# Patient Record
Sex: Female | Born: 1970 | Race: White | Hispanic: No | Marital: Single | Smoking: Former smoker
Health system: Southern US, Community
[De-identification: ages and names within clinical notes are randomized; demographics above are authoritative.]

## PROBLEM LIST (undated history)

## (undated) DIAGNOSIS — E039 Hypothyroidism, unspecified: Secondary | ICD-10-CM

## (undated) DIAGNOSIS — E785 Hyperlipidemia, unspecified: Secondary | ICD-10-CM

## (undated) DIAGNOSIS — F419 Anxiety disorder, unspecified: Secondary | ICD-10-CM

## (undated) HISTORY — PX: TUBAL LIGATION: SHX77

---

## 2006-05-13 ENCOUNTER — Other Ambulatory Visit: Admission: RE | Admit: 2006-05-13 | Discharge: 2006-05-13 | Payer: Self-pay | Admitting: Obstetrics and Gynecology

## 2006-07-01 ENCOUNTER — Emergency Department (HOSPITAL_COMMUNITY): Admission: EM | Admit: 2006-07-01 | Discharge: 2006-07-01 | Payer: Self-pay | Admitting: Emergency Medicine

## 2016-02-17 ENCOUNTER — Other Ambulatory Visit (HOSPITAL_COMMUNITY): Payer: Self-pay | Admitting: Obstetrics and Gynecology

## 2016-02-17 ENCOUNTER — Other Ambulatory Visit: Payer: Self-pay | Admitting: Obstetrics and Gynecology

## 2016-02-17 DIAGNOSIS — Z308 Encounter for other contraceptive management: Secondary | ICD-10-CM

## 2016-02-17 DIAGNOSIS — Z3040 Encounter for surveillance of contraceptives, unspecified: Secondary | ICD-10-CM

## 2016-03-26 ENCOUNTER — Ambulatory Visit (HOSPITAL_COMMUNITY): Admission: RE | Admit: 2016-03-26 | Payer: Self-pay | Source: Ambulatory Visit

## 2016-03-26 ENCOUNTER — Ambulatory Visit (HOSPITAL_COMMUNITY): Payer: Self-pay

## 2016-03-27 ENCOUNTER — Ambulatory Visit (HOSPITAL_COMMUNITY): Payer: Self-pay

## 2016-05-11 ENCOUNTER — Ambulatory Visit (HOSPITAL_COMMUNITY): Payer: Self-pay

## 2016-06-29 ENCOUNTER — Ambulatory Visit (HOSPITAL_COMMUNITY): Payer: Self-pay

## 2016-07-10 ENCOUNTER — Encounter (INDEPENDENT_AMBULATORY_CARE_PROVIDER_SITE_OTHER): Payer: Self-pay

## 2016-07-10 ENCOUNTER — Ambulatory Visit (HOSPITAL_COMMUNITY)
Admission: RE | Admit: 2016-07-10 | Discharge: 2016-07-10 | Disposition: A | Discharge status: Home / Self Care | Payer: BLUE CROSS/BLUE SHIELD | Source: Ambulatory Visit | Attending: Obstetrics and Gynecology | Admitting: Obstetrics and Gynecology

## 2016-07-10 DIAGNOSIS — N971 Female infertility of tubal origin: Secondary | ICD-10-CM | POA: Diagnosis not present

## 2016-07-10 DIAGNOSIS — Z308 Encounter for other contraceptive management: Secondary | ICD-10-CM | POA: Diagnosis not present

## 2016-07-10 DIAGNOSIS — Z3040 Encounter for surveillance of contraceptives, unspecified: Secondary | ICD-10-CM

## 2016-07-10 DIAGNOSIS — Z975 Presence of (intrauterine) contraceptive device: Secondary | ICD-10-CM | POA: Diagnosis not present

## 2016-07-10 DIAGNOSIS — Z9851 Tubal ligation status: Secondary | ICD-10-CM | POA: Diagnosis not present

## 2016-07-10 MED ORDER — IOPAMIDOL (ISOVUE-300) INJECTION 61%
30.0000 mL | Freq: Once | INTRAVENOUS | Status: AC | PRN
  Administered 2016-07-10: 3 mL

## 2016-07-10 NOTE — Procedures (Signed)
Kristen Davies is a 45 y.o. female,G1P1, who is status post Essure sterilization on 01/06/16 without complication.   Procedure with R&B was reviewed and patient was consented.   A speculum was inserted in the vagina and cervix was prepped with Betadine after confirming absence of allergy.  The Hysterogram catheter was inserted without difficulty and its balloon was inflated with air.  The speculum was removed.  We proceeded to obtain all required images to confirm complete bilateral tubal blockage and appropriate placement of both Essure coils.  The catheter was removed.  Images were reviewed with Dr Eppie GibsonStahl  who confirms our findings.   Findings were reviewed with patient who is discharged home in a well and stable condition.   Silverio LaySandra Mansel Strother MD

## 2016-07-17 DIAGNOSIS — Z1231 Encounter for screening mammogram for malignant neoplasm of breast: Secondary | ICD-10-CM | POA: Diagnosis not present

## 2016-07-17 DIAGNOSIS — Z3040 Encounter for surveillance of contraceptives, unspecified: Secondary | ICD-10-CM | POA: Diagnosis not present

## 2016-07-17 DIAGNOSIS — Z124 Encounter for screening for malignant neoplasm of cervix: Secondary | ICD-10-CM | POA: Diagnosis not present

## 2016-07-17 DIAGNOSIS — Z01419 Encounter for gynecological examination (general) (routine) without abnormal findings: Secondary | ICD-10-CM | POA: Diagnosis not present

## 2016-08-31 DIAGNOSIS — E669 Obesity, unspecified: Secondary | ICD-10-CM | POA: Diagnosis not present

## 2016-08-31 DIAGNOSIS — E039 Hypothyroidism, unspecified: Secondary | ICD-10-CM | POA: Diagnosis not present

## 2016-08-31 DIAGNOSIS — F419 Anxiety disorder, unspecified: Secondary | ICD-10-CM | POA: Diagnosis not present

## 2016-08-31 DIAGNOSIS — Z713 Dietary counseling and surveillance: Secondary | ICD-10-CM | POA: Diagnosis not present

## 2017-02-01 DIAGNOSIS — E669 Obesity, unspecified: Secondary | ICD-10-CM | POA: Diagnosis not present

## 2017-02-01 DIAGNOSIS — E039 Hypothyroidism, unspecified: Secondary | ICD-10-CM | POA: Diagnosis not present

## 2017-02-01 DIAGNOSIS — F419 Anxiety disorder, unspecified: Secondary | ICD-10-CM | POA: Diagnosis not present

## 2017-07-29 IMAGING — RF DG HYSTEROGRAM
5 series · 10 of 10 positions shown · non-contrast
Comparison: None.

CLINICAL DATA: Status post Essure microinsert placement for
contraception. Evaluate microinsert location and tubal patency.

EXAM:
HYSTEROSALPINGOGRAM
TECHNIQUE: Hysterosalpingogram was performed by the ordering physician under
fluoroscopy. Fluoroscopic images were submitted for radiologic
interpretation following the procedure. Please see the procedural
report for the amount of contrast and the fluoroscopy time utilized.

[Series 1: run · 2 of 2 slices shown (1 of 5)]
[im 1/2]
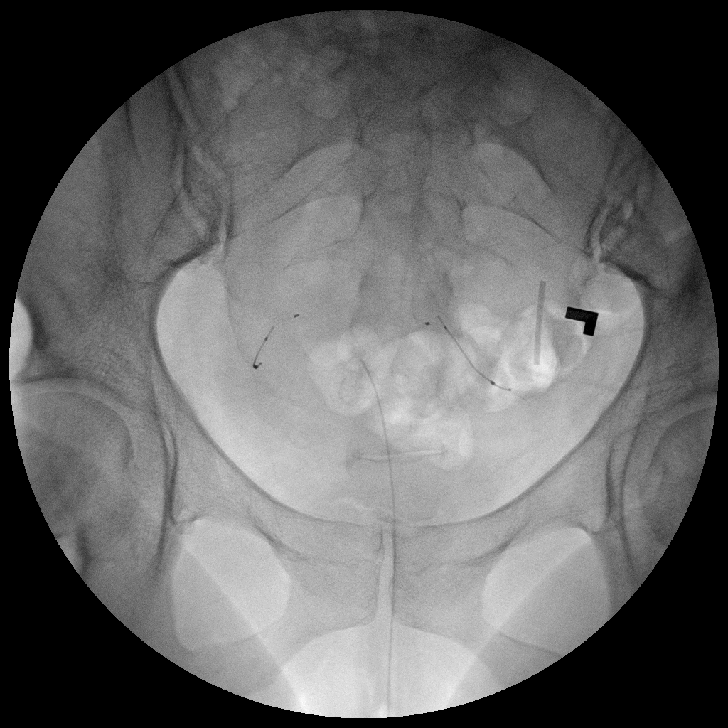
[im 2/2]
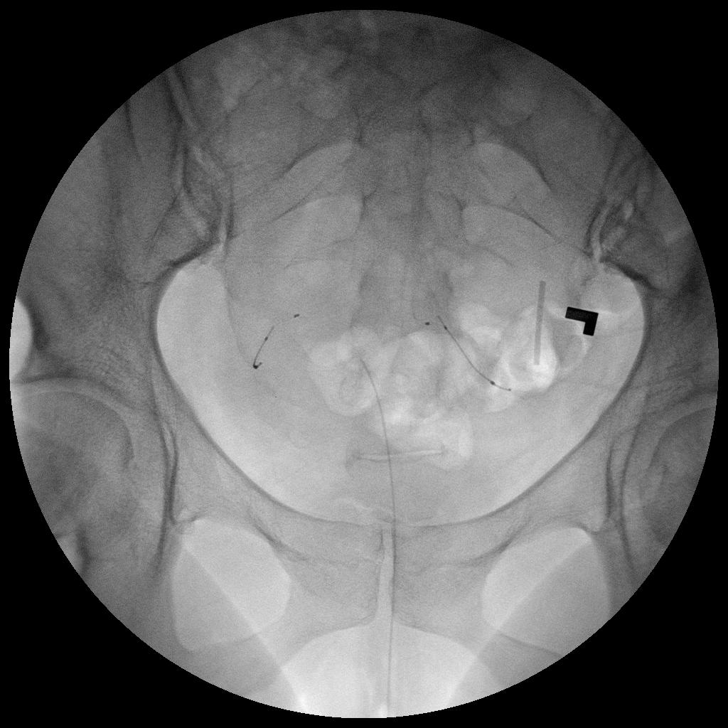

[Series 2: run · 2 of 2 slices shown (2 of 5)]
[im 1/2]
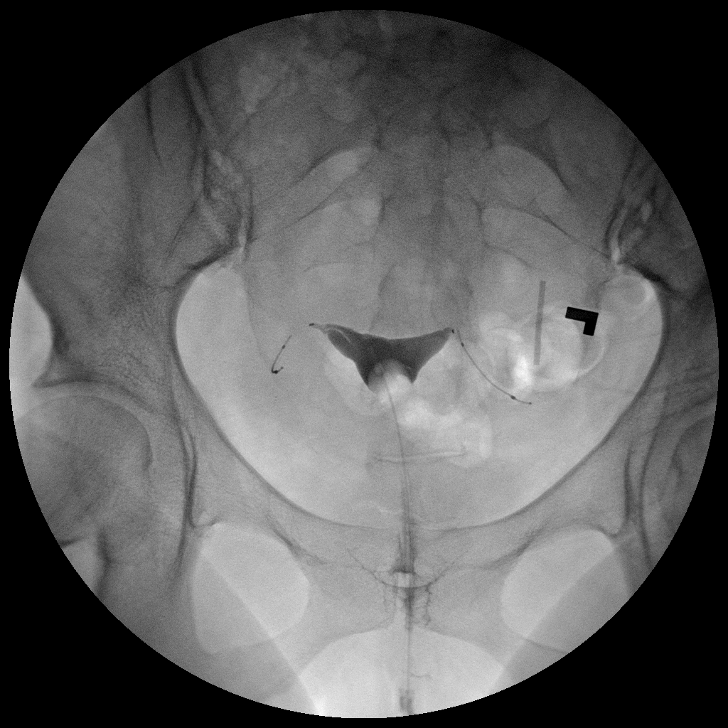
[im 2/2]
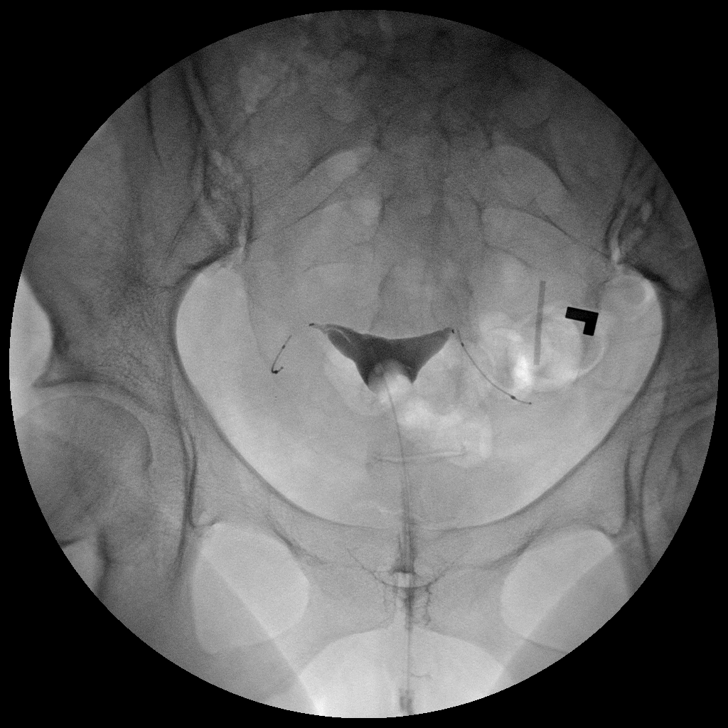

[Series 3: run · 2 of 2 slices shown (3 of 5)]
[im 1/2]
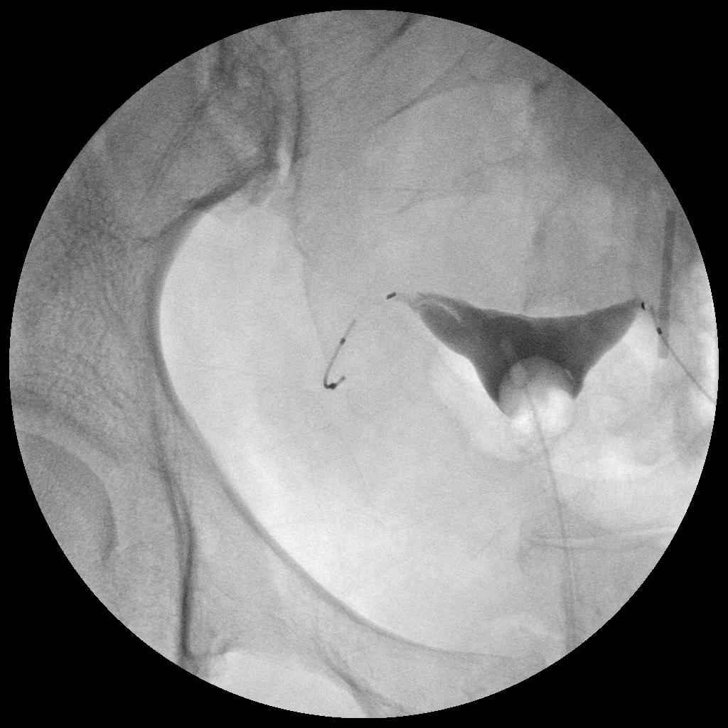
[im 2/2]
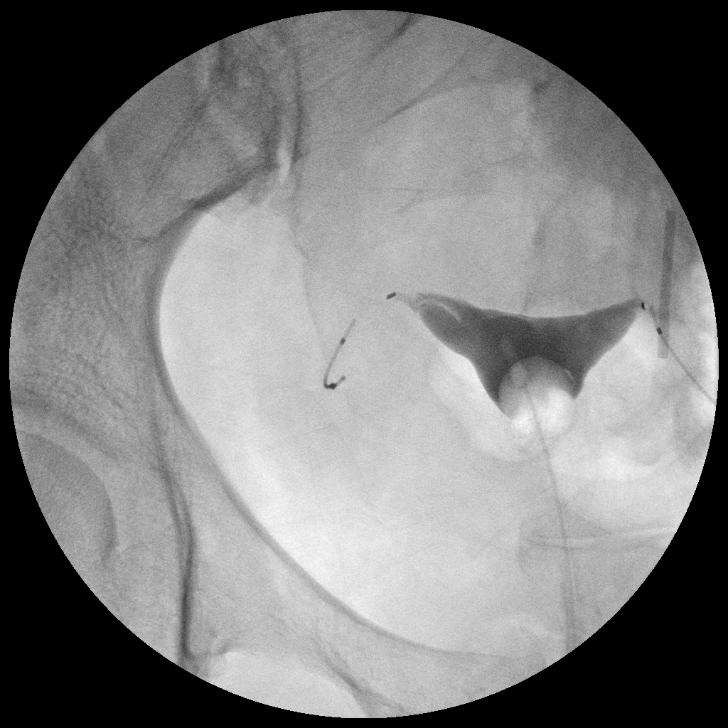

[Series 4: run · 2 of 2 slices shown (4 of 5)]
[im 1/2]
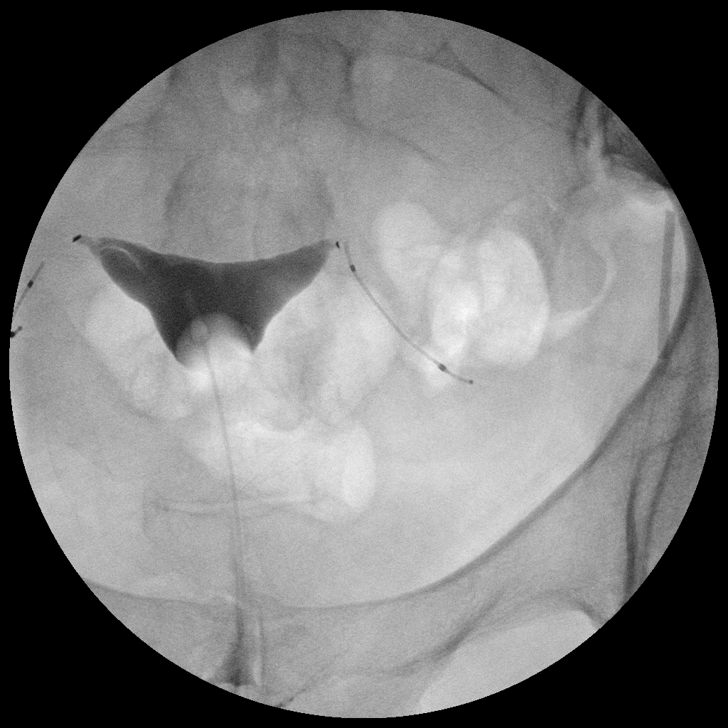
[im 2/2]
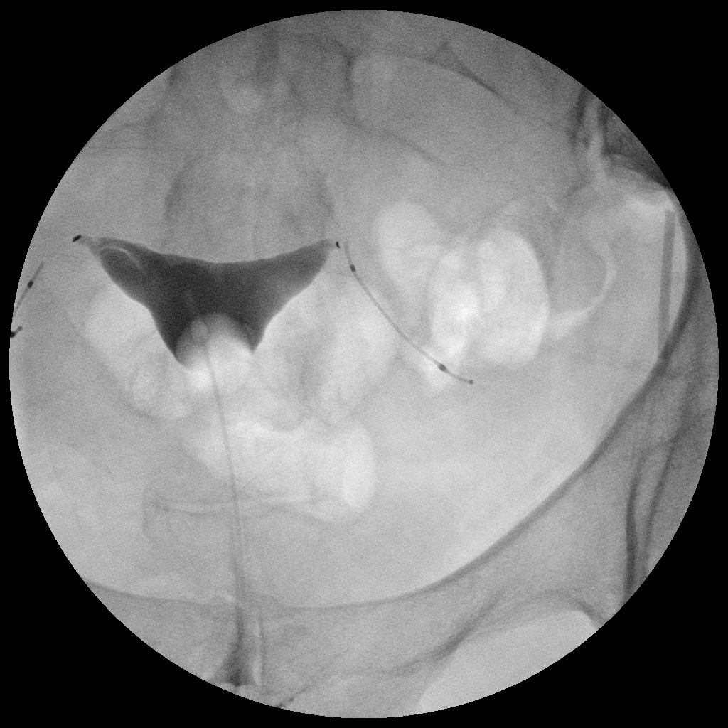

[Series 5: run · 2 of 2 slices shown (5 of 5)]
[im 1/2]
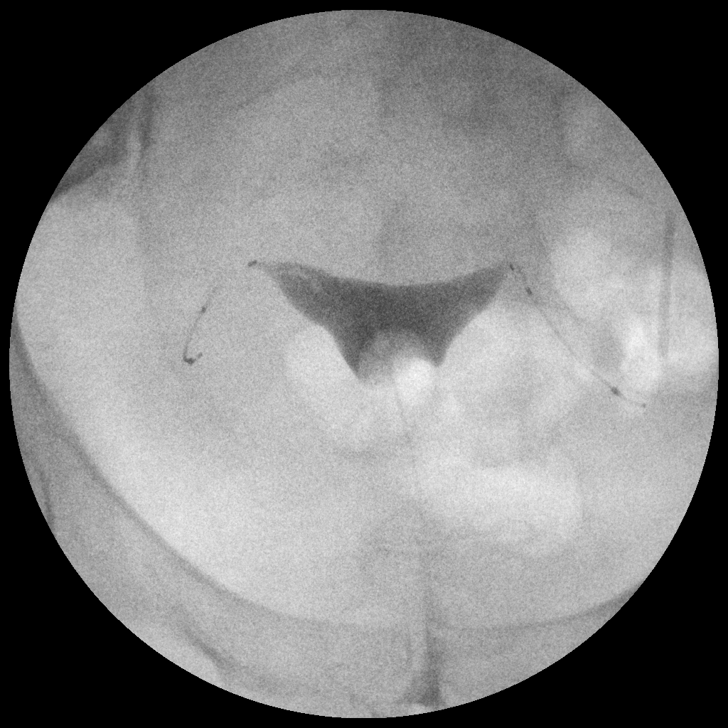
[im 2/2]
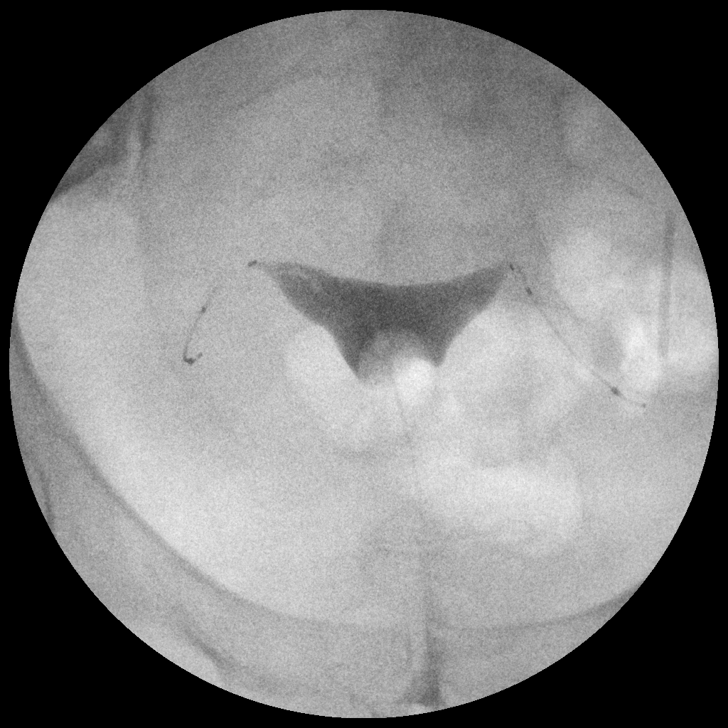

[10 of 10 positions shown; findings below may reference images not displayed]

FINDINGS: Endometrial Cavity: Normal appearance. No signs of Mullerian duct
anomaly or other significant abnormality.

Right Fallopian Tube: The Essure microinsert is in satisfactory
location. No contrast opacification of the right fallopian tube,
consistent with occlusion at the cornua (category 1).

Left Fallopian Tube: The Essure microinsert is in satisfactory
location. No contrast opacification of the right fallopian tube
seen, consistent with occlusion at the cornua (category 1).

Other:  None.
IMPRESSION: Both Essure microinserts in satisfactory location, with occlusion of
both fallopian tubes at the cornua (category 1).

## 2017-12-11 DIAGNOSIS — F419 Anxiety disorder, unspecified: Secondary | ICD-10-CM | POA: Diagnosis not present

## 2017-12-11 DIAGNOSIS — E669 Obesity, unspecified: Secondary | ICD-10-CM | POA: Diagnosis not present

## 2017-12-11 DIAGNOSIS — M542 Cervicalgia: Secondary | ICD-10-CM | POA: Diagnosis not present

## 2018-01-07 DIAGNOSIS — Z1231 Encounter for screening mammogram for malignant neoplasm of breast: Secondary | ICD-10-CM | POA: Diagnosis not present

## 2018-01-10 DIAGNOSIS — Z113 Encounter for screening for infections with a predominantly sexual mode of transmission: Secondary | ICD-10-CM | POA: Diagnosis not present

## 2018-01-10 DIAGNOSIS — Z6834 Body mass index (BMI) 34.0-34.9, adult: Secondary | ICD-10-CM | POA: Diagnosis not present

## 2018-01-10 DIAGNOSIS — Z3040 Encounter for surveillance of contraceptives, unspecified: Secondary | ICD-10-CM | POA: Diagnosis not present

## 2018-01-10 DIAGNOSIS — Z01419 Encounter for gynecological examination (general) (routine) without abnormal findings: Secondary | ICD-10-CM | POA: Diagnosis not present

## 2018-09-04 ENCOUNTER — Other Ambulatory Visit: Payer: Self-pay | Admitting: Physician Assistant

## 2018-09-04 DIAGNOSIS — R748 Abnormal levels of other serum enzymes: Secondary | ICD-10-CM

## 2018-09-11 ENCOUNTER — Ambulatory Visit
Admission: RE | Admit: 2018-09-11 | Discharge: 2018-09-11 | Disposition: A | Discharge status: Home / Self Care | Payer: 59 | Source: Ambulatory Visit | Attending: Physician Assistant | Admitting: Physician Assistant

## 2018-09-11 DIAGNOSIS — R748 Abnormal levels of other serum enzymes: Secondary | ICD-10-CM

## 2018-09-30 ENCOUNTER — Other Ambulatory Visit (HOSPITAL_COMMUNITY): Payer: Self-pay | Admitting: Gastroenterology

## 2018-09-30 DIAGNOSIS — R7989 Other specified abnormal findings of blood chemistry: Secondary | ICD-10-CM

## 2018-09-30 DIAGNOSIS — R945 Abnormal results of liver function studies: Principal | ICD-10-CM

## 2018-10-01 ENCOUNTER — Other Ambulatory Visit: Payer: Self-pay | Admitting: Physician Assistant

## 2018-10-02 ENCOUNTER — Encounter (HOSPITAL_COMMUNITY): Payer: Self-pay | Admitting: *Deleted

## 2018-10-02 ENCOUNTER — Ambulatory Visit (HOSPITAL_COMMUNITY)
Admission: RE | Admit: 2018-10-02 | Discharge: 2018-10-02 | Disposition: A | Discharge status: Home / Self Care | Payer: 59 | Source: Ambulatory Visit | Attending: Gastroenterology | Admitting: Gastroenterology

## 2018-10-02 ENCOUNTER — Other Ambulatory Visit: Payer: Self-pay

## 2018-10-02 DIAGNOSIS — Z79899 Other long term (current) drug therapy: Secondary | ICD-10-CM | POA: Diagnosis not present

## 2018-10-02 DIAGNOSIS — R7989 Other specified abnormal findings of blood chemistry: Secondary | ICD-10-CM

## 2018-10-02 DIAGNOSIS — Z87891 Personal history of nicotine dependence: Secondary | ICD-10-CM | POA: Insufficient documentation

## 2018-10-02 DIAGNOSIS — R945 Abnormal results of liver function studies: Secondary | ICD-10-CM | POA: Diagnosis not present

## 2018-10-02 DIAGNOSIS — F419 Anxiety disorder, unspecified: Secondary | ICD-10-CM | POA: Diagnosis not present

## 2018-10-02 DIAGNOSIS — Z79890 Hormone replacement therapy (postmenopausal): Secondary | ICD-10-CM | POA: Diagnosis not present

## 2018-10-02 DIAGNOSIS — E039 Hypothyroidism, unspecified: Secondary | ICD-10-CM | POA: Diagnosis not present

## 2018-10-02 DIAGNOSIS — E785 Hyperlipidemia, unspecified: Secondary | ICD-10-CM | POA: Diagnosis not present

## 2018-10-02 HISTORY — DX: Hypothyroidism, unspecified: E03.9

## 2018-10-02 HISTORY — DX: Hyperlipidemia, unspecified: E78.5

## 2018-10-02 HISTORY — DX: Anxiety disorder, unspecified: F41.9

## 2018-10-02 LAB — CBC
HEMATOCRIT: 44 % (ref 36.0–46.0)
Hemoglobin: 14.3 g/dL (ref 12.0–15.0)
MCH: 30 pg (ref 26.0–34.0)
MCHC: 32.5 g/dL (ref 30.0–36.0)
MCV: 92.2 fL (ref 80.0–100.0)
Platelets: 284 10*3/uL (ref 150–400)
RBC: 4.77 MIL/uL (ref 3.87–5.11)
RDW: 12.7 % (ref 11.5–15.5)
WBC: 12 10*3/uL — AB (ref 4.0–10.5)
nRBC: 0 % (ref 0.0–0.2)

## 2018-10-02 LAB — PROTIME-INR
INR: 0.86
Prothrombin Time: 11.7 seconds (ref 11.4–15.2)

## 2018-10-02 LAB — APTT: aPTT: 31 seconds (ref 24–36)

## 2018-10-02 MED ORDER — HYDROCODONE-ACETAMINOPHEN 5-325 MG PO TABS
1.0000 | ORAL_TABLET | ORAL | Status: DC | PRN
  Administered 2018-10-02: 1 via ORAL
  Filled 2018-10-02: qty 1

## 2018-10-02 MED ORDER — MIDAZOLAM HCL 2 MG/2ML IJ SOLN
INTRAMUSCULAR | Status: AC | PRN
  Administered 2018-10-02 (×2): 1 mg via INTRAVENOUS

## 2018-10-02 MED ORDER — LIDOCAINE HCL 1 % IJ SOLN
INTRAMUSCULAR | Status: AC | PRN
  Administered 2018-10-02: 10 mL

## 2018-10-02 MED ORDER — LIDOCAINE HCL 1 % IJ SOLN
INTRAMUSCULAR | Status: AC
  Filled 2018-10-02: qty 20

## 2018-10-02 MED ORDER — FENTANYL CITRATE (PF) 100 MCG/2ML IJ SOLN
INTRAMUSCULAR | Status: AC
  Filled 2018-10-02: qty 2

## 2018-10-02 MED ORDER — MIDAZOLAM HCL 2 MG/2ML IJ SOLN
INTRAMUSCULAR | Status: AC
  Filled 2018-10-02: qty 4

## 2018-10-02 MED ORDER — SODIUM CHLORIDE 0.9 % IV SOLN
INTRAVENOUS | Status: DC
  Administered 2018-10-02: 12:00:00 via INTRAVENOUS

## 2018-10-02 MED ORDER — FENTANYL CITRATE (PF) 100 MCG/2ML IJ SOLN
INTRAMUSCULAR | Status: AC | PRN
  Administered 2018-10-02 (×2): 50 ug via INTRAVENOUS

## 2018-10-02 MED ORDER — GELATIN ABSORBABLE 12-7 MM EX MISC
CUTANEOUS | Status: AC
  Filled 2018-10-02: qty 1

## 2018-10-02 NOTE — H&P (Signed)
Chief Complaint: Patient was seen in consultation today for elevated LFTs.  Referring Physician(s): Kerin Salen  Supervising Physician: Jolaine Click  Patient Status: [facility] - Out-pt  History of Present Illness: Kristen Davies is a 48 y.o. female with a past medical history of hyperlipidemia, hypothyroidism, and anxiety. She was found to have unexplained elevated LFTs. She was referred to GI for further investigation and has been seen by Dr. Marca Ancona. Upon work-up, her RUQ ultrasound came back unremarkable.  US abdomen limited RUQ 09/11/2018: 1. Unremarkable right upper quadrant ultrasound.  IR requested by Dr. Marca Ancona for possible image-guided liver biopsy to possibly evaluate cause of elevated LFTs. Patient awake and alert laying in bed with no complaints at this time. Denies fever, chills, chest pain, dyspnea, abdominal pain, or headache.   Past Medical History:  Diagnosis Date  . Anxiety   . Hyperlipidemia   . Hypothyroidism     Past Surgical History:  Procedure Laterality Date  . TUBAL LIGATION      Allergies: Patient has no known allergies.  Medications: Prior to Admission medications   Medication Sig Start Date End Date Taking? Authorizing Provider  atorvastatin (LIPITOR) 20 MG tablet Take 20 mg by mouth daily.   Yes [provider]  levothyroxine (SYNTHROID, LEVOTHROID) 100 MCG tablet Take 100 mcg by mouth daily before breakfast.   Yes [provider]  LORazepam (ATIVAN) 0.5 MG tablet Take 0.5 mg by mouth every 8 (eight) hours as needed for anxiety.    [provider]     History reviewed. No pertinent family history.  Social History   Socioeconomic History  . Marital status: Single    Spouse name: Not on file  . Number of children: Not on file  . Years of education: Not on file  . Highest education level: Not on file  Occupational History  . Not on file  Social Needs  . Financial resource strain: Not on file  . Food insecurity:      Worry: Not on file    Inability: Not on file  . Transportation needs:    Medical: Not on file    Non-medical: Not on file  Tobacco Use  . Smoking status: Former Games developer  . Smokeless tobacco: Never Used  Substance and Sexual Activity  . Alcohol use: Not on file  . Drug use: Not on file  . Sexual activity: Not on file  Lifestyle  . Physical activity:    Days per week: Not on file    Minutes per session: Not on file  . Stress: Not on file  Relationships  . Social connections:    Talks on phone: Not on file    Gets together: Not on file    Attends religious service: Not on file    Active member of club or organization: Not on file    Attends meetings of clubs or organizations: Not on file    Relationship status: Not on file  Other Topics Concern  . Not on file  Social History Narrative  . Not on file     Review of Systems: A 12 point ROS discussed and pertinent positives are indicated in the HPI above.  All other systems are negative.  Review of Systems  Constitutional: Negative for chills and fever.  Respiratory: Negative for shortness of breath and wheezing.   Cardiovascular: Negative for chest pain and palpitations.  Gastrointestinal: Negative for abdominal pain.  Neurological: Negative for headaches.  Psychiatric/Behavioral: Negative for behavioral problems and confusion.  Vital Signs: BP 120/85   Pulse 66   Temp 98.1 F (36.7 C) (Oral)   Resp 16   Ht 5\' 2"  (1.575 m)   Wt 196 lb (88.9 kg)   LMP 09/24/2018   SpO2 97%   BMI 35.85 kg/m   Physical Exam Vitals signs and nursing note reviewed.  Constitutional:      General: She is not in acute distress.    Appearance: Normal appearance.  Cardiovascular:     Rate and Rhythm: Normal rate and regular rhythm.     Heart sounds: Normal heart sounds. No murmur.  Pulmonary:     Effort: Pulmonary effort is normal. No respiratory distress.     Breath sounds: Normal breath sounds. No wheezing.  Skin:     General: Skin is warm and dry.  Neurological:     Mental Status: She is alert and oriented to person, place, and time.  Psychiatric:        Mood and Affect: Mood normal.        Behavior: Behavior normal.        Thought Content: Thought content normal.        Judgment: Judgment normal.      MD Evaluation Airway: WNL Heart: WNL Abdomen: WNL ASA  Classification: 2 Mallampati/Airway Score: One   Imaging: [place] Abdomen Limited Ruq  Result Date: 09/11/2018 CLINICAL DATA:  Elevated LFTs EXAM: ULTRASOUND ABDOMEN LIMITED RIGHT UPPER QUADRANT COMPARISON:  None. FINDINGS: Gallbladder: No gallstones or wall thickening visualized. No sonographic Murphy sign noted by sonographer. Common bile duct: Diameter: Normal caliber, 2 mm Liver: No focal lesion identified. Within normal limits in parenchymal echogenicity. Portal vein is patent on color Doppler imaging with normal direction of blood flow towards the liver. IMPRESSION: Unremarkable right upper quadrant ultrasound. Electronically Signed   By: Charlett NoseKevin  Dover M.D.   On: 09/11/2018 09:06    Labs:  CBC: Recent Labs    10/02/18 1154  WBC 12.0*  HGB 14.3  HCT 44.0  PLT 284    COAGS: No results for input(s): INR, APTT in the last 8760 hours.   Assessment and Plan:  Elevated LFTs. Plan for image-guided liver biopsy today with Dr. Bonnielee HaffHoss. Patient is NPO. Afebrile. She does not take blood thinners. INR 0.86 seconds today.  Risks and benefits discussed with the patient including, but not limited to bleeding, infection, damage to adjacent structures or low yield requiring additional tests. All of the patient's questions were answered, patient is agreeable to proceed. Consent signed and in chart.   Thank you for this interesting consult.  I greatly enjoyed meeting Kristen Davies and look forward to participating in their care.  A copy of this report was sent to the requesting provider on this date.  Electronically Signed: Elwin MochaAlexandra Eleni Frank,  PA-C 10/02/2018, 12:18 PM   I spent a total of 40 Minutes in face to face in clinical consultation, greater than 50% of which was counseling/coordinating care for elevated LFTs.

## 2018-10-02 NOTE — Discharge Instructions (Signed)
Liver Biopsy, Care After °These instructions give you information on caring for yourself after your procedure. Your doctor may also give you more specific instructions. Call your doctor if you have any problems or questions after your procedure. °What can I expect after the procedure? °After the procedure, it is common to have: °· Pain and soreness where the biopsy was done. °· Bruising around the area where the biopsy was done. °· Sleepiness and be tired for a few days. °Follow these instructions at home: °Medicines °· Take over-the-counter and prescription medicines only as told by your doctor. °· If you were prescribed an antibiotic medicine, take it as told by your doctor. Do not stop taking the antibiotic even if you start to feel better. °· Do not take medicines such as aspirin and ibuprofen. These medicines can thin your blood. Do not take these medicines unless your doctor tells you to take them. °· If you are taking prescription pain medicine, take actions to prevent or treat constipation. Your doctor may recommend that you: °? Drink enough fluid to keep your pee (urine) clear or pale yellow. °? Take over-the-counter or prescription medicines. °? Eat foods that are high in fiber, such as fresh fruits and vegetables, whole grains, and beans. °? Limit foods that are high in fat and processed sugars, such as fried and sweet foods. °Caring for your cut °· Follow instructions from your doctor about how to take care of your cuts from surgery (incisions). Make sure you: °? Wash your hands with soap and water before you change your bandage (dressing). If you cannot use soap and water, use hand sanitizer. °? Change your bandage as told by your doctor. °? Leave stitches (sutures), skin glue, or skin tape (adhesive) strips in place. They may need to stay in place for 2 weeks or longer. If tape strips get loose and curl up, you may trim the loose edges. Do not remove tape strips completely unless your doctor says it is  okay. °· Check your cuts every day for signs of infection. Check for: °? Redness, swelling, or more pain. °? Fluid or blood. °? Pus or a bad smell. °? Warmth. °· Do not take baths, swim, or use a hot tub until your doctor says it is okay to do so. °Activity ° °· Rest at home for 1-2 days or as told by your doctor. °? Avoid sitting for a long time without moving. Get up to take short walks every 1-2 hours. °· Return to your normal activities as told by your doctor. Ask what activities are safe for you. °· Do not do these things in the first 24 hours: °? Drive. °? Use machinery. °? Take a bath or shower. °· Do not lift more than 10 pounds (4.5 kg) or play contact sports for the first 2 weeks. °General instructions ° °· Do not drink alcohol in the first week after the procedure. °· Have someone stay with you for at least 24 hours after the procedure. °· Get your test results. Ask your doctor or the department that is doing the test: °? When will my results be ready? °? How will I get my results? °? What are my treatment options? °? What other tests do I need? °? What are my next steps? °· Keep all follow-up visits as told by your doctor. This is important. °Contact a doctor if: °· A cut bleeds and leaves more than just a small spot of blood. °· A cut is red, puffs up (  swells), or hurts more than before. °· Fluid or something else comes from a cut. °· A cut smells bad. °· You have a fever or chills. °Get help right away if: °· You have swelling, bloating, or pain in your belly (abdomen). °· You get dizzy or faint. °· You have a rash. °· You feel sick to your stomach (nauseous) or throw up (vomit). °· You have trouble breathing, feel short of breath, or feel faint. °· Your chest hurts. °· You have problems talking or seeing. °· You have trouble with your balance or moving your arms or legs. °Summary °· After the procedure, it is common to have pain, soreness, bruising, and tiredness. °· Your doctor will tell you how to  take care of yourself at home. Change your bandage, take your medicines, and limit your activities as told by your doctor. °· Call your doctor if you have symptoms of infection. Get help right away if your belly swells, your cut bleeds a lot, or you have trouble talking or breathing. °This information is not intended to replace advice given to you by your health care provider. Make sure you discuss any questions you have with your health care provider. °Document Released: 05/29/2008 Document Revised: 08/30/2017 Document Reviewed: 08/30/2017 °Elsevier Interactive Patient Education © 2019 [employer] °Moderate Conscious Sedation, Adult, Care After °These instructions provide you with information about caring for yourself after your procedure. Your health care provider may also give you more specific instructions. Your treatment has been planned according to current medical practices, but problems sometimes occur. Call your health care provider if you have any problems or questions after your procedure. °What can I expect after the procedure? °After your procedure, it is common: °· To feel sleepy for several hours. °· To feel clumsy and have poor balance for several hours. °· To have poor judgment for several hours. °· To vomit if you eat too soon. °Follow these instructions at home: °For at least 24 hours after the procedure: ° °· Do not: °? Participate in activities where you could fall or become injured. °? Drive. °? Use heavy machinery. °? Drink alcohol. °? Take sleeping pills or medicines that cause drowsiness. °? Make important decisions or sign legal documents. °? Take care of children on your own. °· Rest. °Eating and drinking °· Follow the diet recommended by your health care provider. °· If you vomit: °? Drink water, juice, or soup when you can drink without vomiting. °? Make sure you have little or no nausea before eating solid foods. °General instructions °· Have a responsible adult stay with you until  you are awake and alert. °· Take over-the-counter and prescription medicines only as told by your health care provider. °· If you smoke, do not smoke without supervision. °· Keep all follow-up visits as told by your health care provider. This is important. °Contact a health care provider if: °· You keep feeling nauseous or you keep vomiting. °· You feel light-headed. °· You develop a rash. °· You have a fever. °Get help right away if: °· You have trouble breathing. °This information is not intended to replace advice given to you by your health care provider. Make sure you discuss any questions you have with your health care provider. °Document Released: 06/10/2013 Document Revised: 01/23/2016 Document Reviewed: 12/10/2015 °Elsevier Interactive Patient Education © 2019 [employer] ° °

## 2018-10-02 NOTE — Procedures (Signed)
Random liver Core Bx 18 g times four EBL 0 Comp 0

## 2019-08-04 DIAGNOSIS — Z1159 Encounter for screening for other viral diseases: Secondary | ICD-10-CM | POA: Diagnosis not present

## 2019-09-07 DIAGNOSIS — Z1159 Encounter for screening for other viral diseases: Secondary | ICD-10-CM | POA: Diagnosis not present

## 2019-09-10 DIAGNOSIS — Z1159 Encounter for screening for other viral diseases: Secondary | ICD-10-CM | POA: Diagnosis not present

## 2019-09-17 DIAGNOSIS — Z1159 Encounter for screening for other viral diseases: Secondary | ICD-10-CM | POA: Diagnosis not present

## 2019-09-22 DIAGNOSIS — Z1159 Encounter for screening for other viral diseases: Secondary | ICD-10-CM | POA: Diagnosis not present

## 2019-09-29 DIAGNOSIS — Z1159 Encounter for screening for other viral diseases: Secondary | ICD-10-CM | POA: Diagnosis not present

## 2019-10-01 DIAGNOSIS — Z1159 Encounter for screening for other viral diseases: Secondary | ICD-10-CM | POA: Diagnosis not present

## 2019-10-08 DIAGNOSIS — Z1159 Encounter for screening for other viral diseases: Secondary | ICD-10-CM | POA: Diagnosis not present

## 2019-10-13 DIAGNOSIS — Z1159 Encounter for screening for other viral diseases: Secondary | ICD-10-CM | POA: Diagnosis not present

## 2019-10-15 DIAGNOSIS — Z1159 Encounter for screening for other viral diseases: Secondary | ICD-10-CM | POA: Diagnosis not present

## 2019-11-26 ENCOUNTER — Other Ambulatory Visit: Payer: Self-pay | Admitting: Family Medicine

## 2019-11-26 ENCOUNTER — Other Ambulatory Visit: Payer: Self-pay

## 2019-11-26 ENCOUNTER — Ambulatory Visit
Admission: RE | Admit: 2019-11-26 | Discharge: 2019-11-26 | Disposition: A | Discharge status: Home / Self Care | Payer: BC Managed Care – PPO | Source: Ambulatory Visit | Attending: Family Medicine | Admitting: Family Medicine

## 2019-11-26 DIAGNOSIS — R109 Unspecified abdominal pain: Secondary | ICD-10-CM | POA: Diagnosis not present

## 2019-11-26 DIAGNOSIS — R1110 Vomiting, unspecified: Secondary | ICD-10-CM | POA: Diagnosis not present

## 2019-11-26 DIAGNOSIS — R1012 Left upper quadrant pain: Secondary | ICD-10-CM | POA: Diagnosis not present

## 2019-11-26 MED ORDER — IOPAMIDOL (ISOVUE-300) INJECTION 61%
100.0000 mL | Freq: Once | INTRAVENOUS | Status: AC | PRN
  Administered 2019-11-26: 100 mL via INTRAVENOUS

## 2019-11-26 NOTE — Progress Notes (Signed)
Pt in for CT scan and after receiving contrast began to develop hives and itching. Pt given 50mg  of Benadryl orally.

## 2019-12-02 DIAGNOSIS — N201 Calculus of ureter: Secondary | ICD-10-CM | POA: Diagnosis not present

## 2020-03-04 DIAGNOSIS — K743 Primary biliary cirrhosis: Secondary | ICD-10-CM | POA: Diagnosis not present

## 2020-04-20 DIAGNOSIS — E039 Hypothyroidism, unspecified: Secondary | ICD-10-CM | POA: Diagnosis not present

## 2020-04-20 DIAGNOSIS — E785 Hyperlipidemia, unspecified: Secondary | ICD-10-CM | POA: Diagnosis not present

## 2020-04-20 DIAGNOSIS — F419 Anxiety disorder, unspecified: Secondary | ICD-10-CM | POA: Diagnosis not present

## 2020-06-04 IMAGING — US US ABDOMEN LIMITED
1 series · 14 of 25 positions shown · non-contrast
Comparison: None.

CLINICAL DATA: Elevated LFTs

EXAM:
ULTRASOUND ABDOMEN LIMITED RIGHT UPPER QUADRANT

[Series 1: us abdomen limited · 0.19mm/px · 14 of 43 slices shown]
[im 1/43]
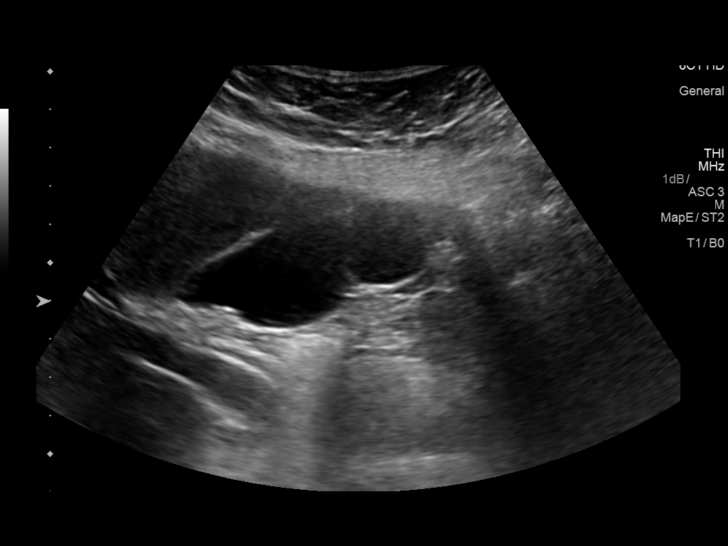
[im 4/43]
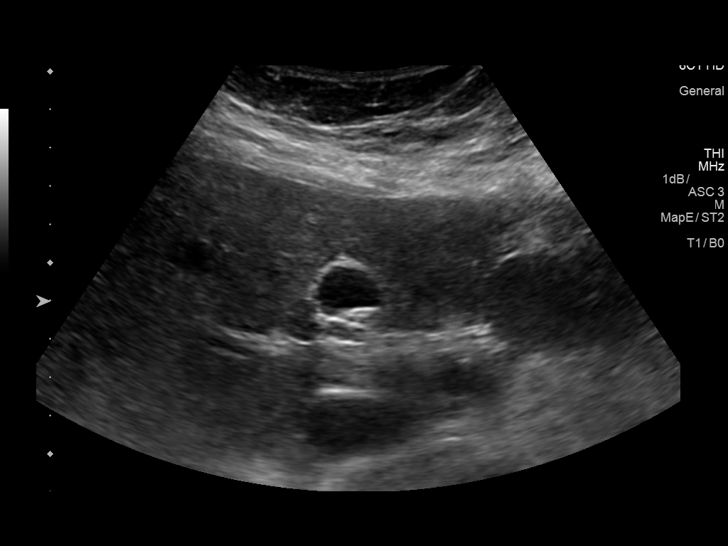
[im 8/43]
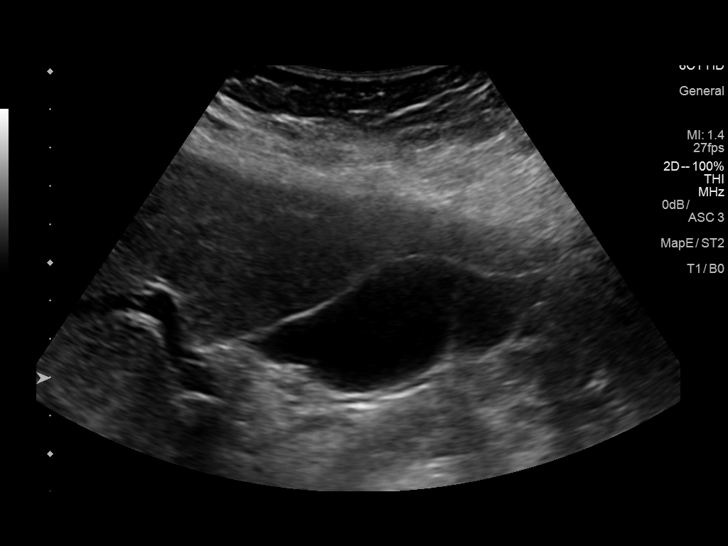
[im 11/43]
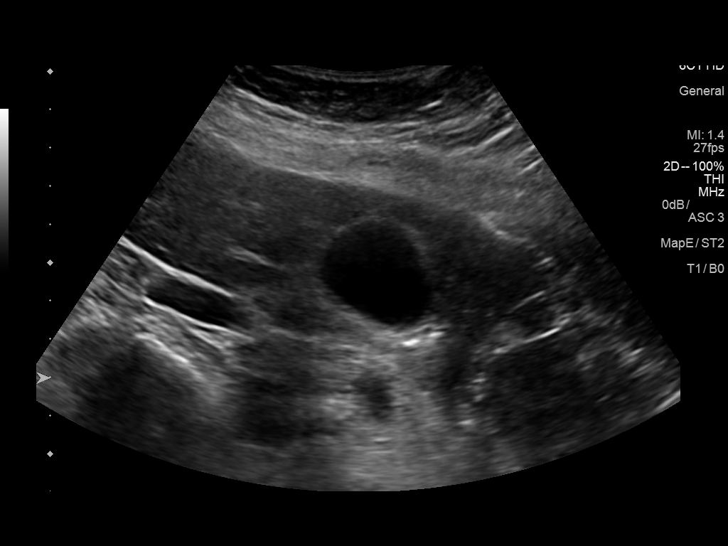
[im 15/43]
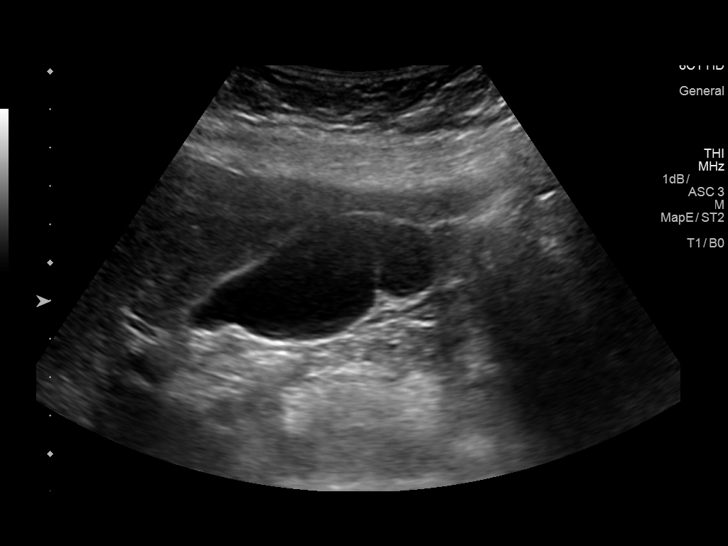
[im 16/43]
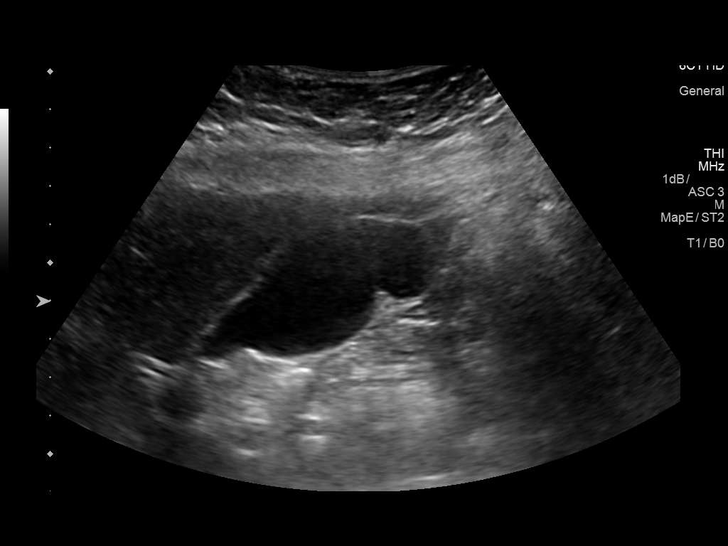
[im 20/43]
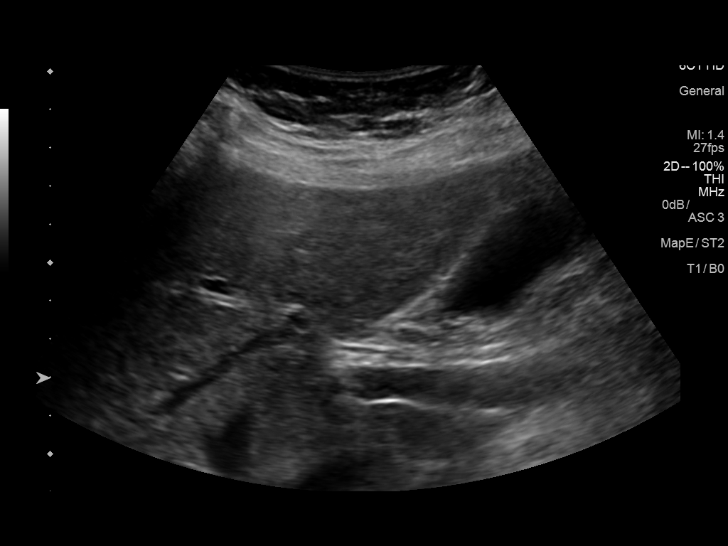
[im 23/43]
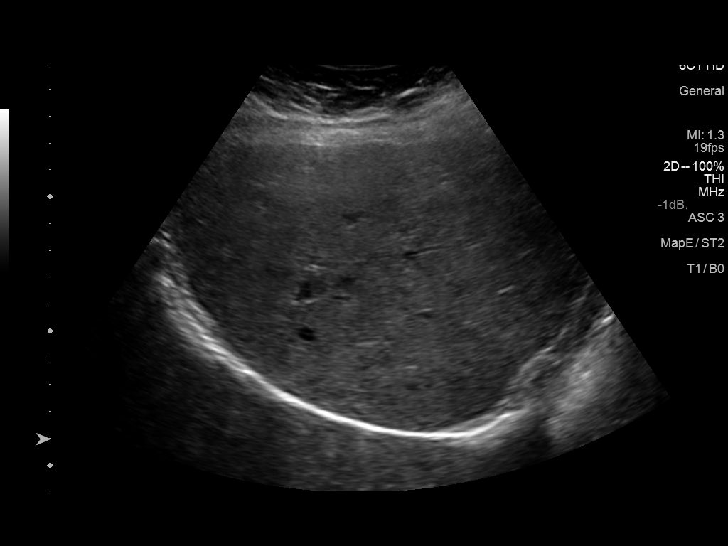
[im 27/43]
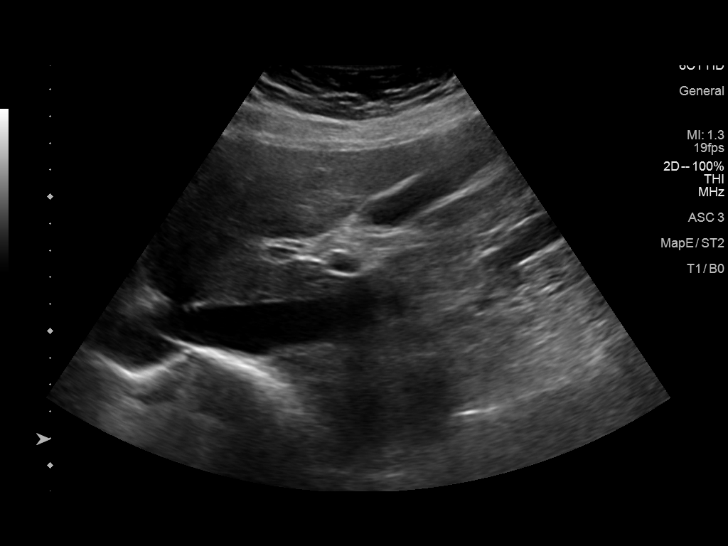
[im 29/43]
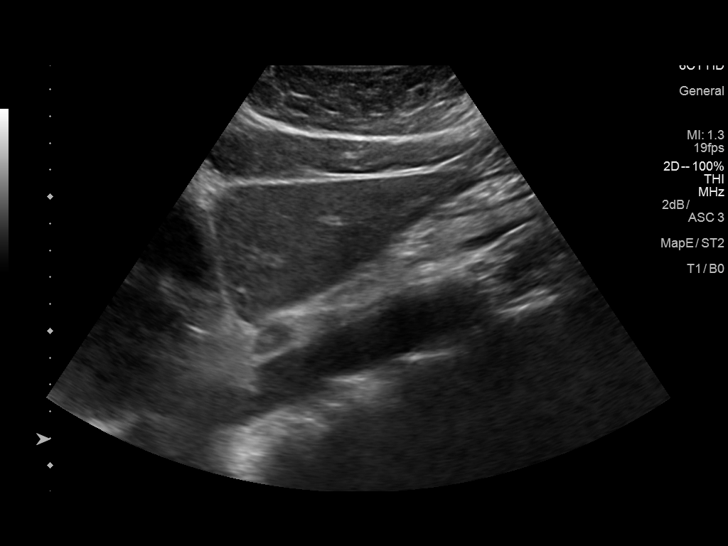
[im 32/43]
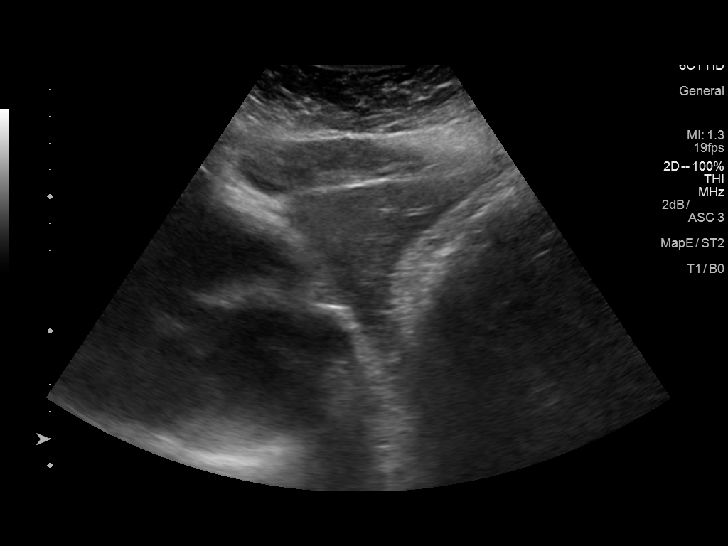
[im 36/43]
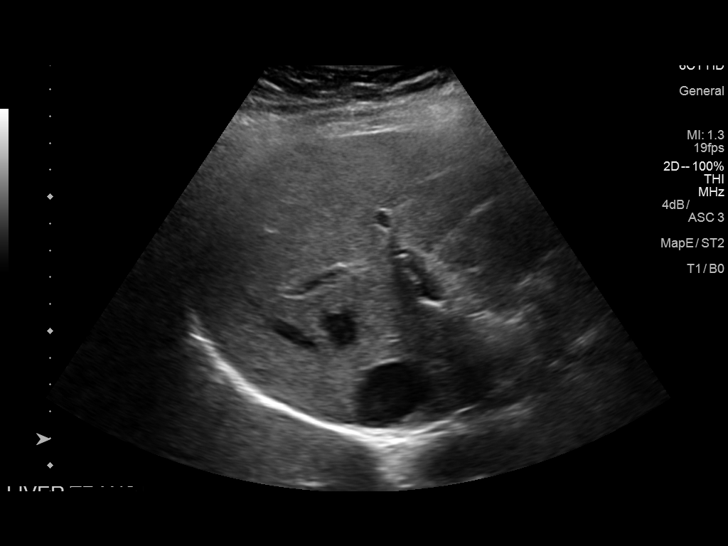
[im 39/43]
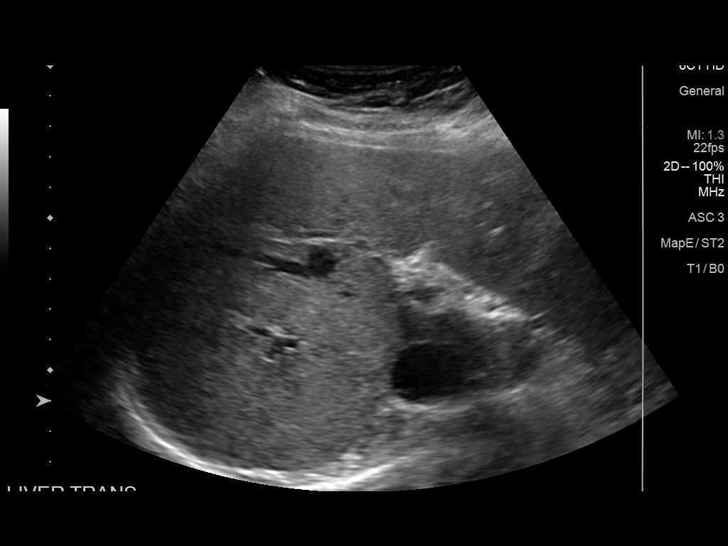
[im 43/43]
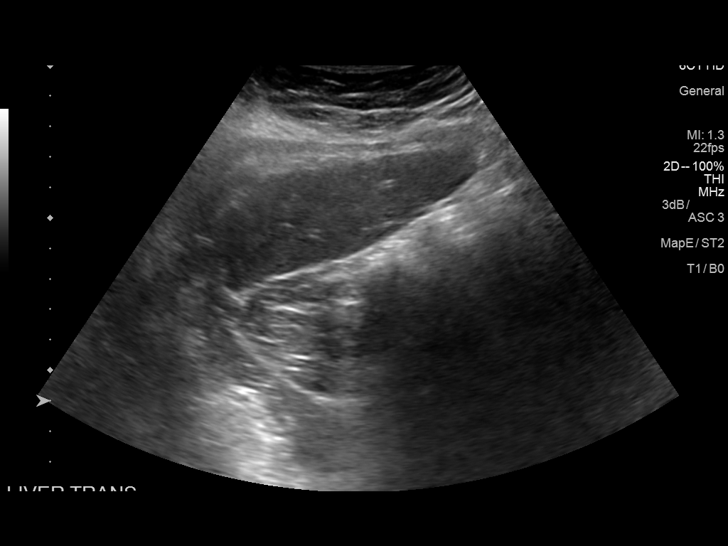

[14 of 25 positions shown; findings below may reference images not displayed]

FINDINGS: Gallbladder:

No gallstones or wall thickening visualized. No sonographic Murphy
sign noted by sonographer.

Common bile duct:

Diameter: Normal caliber, 2 mm

Liver:

No focal lesion identified. Within normal limits in parenchymal
echogenicity. Portal vein is patent on color Doppler imaging with
normal direction of blood flow towards the liver.
IMPRESSION: Unremarkable right upper quadrant ultrasound.

## 2020-08-11 DIAGNOSIS — K743 Primary biliary cirrhosis: Secondary | ICD-10-CM | POA: Diagnosis not present

## 2020-08-16 DIAGNOSIS — Z113 Encounter for screening for infections with a predominantly sexual mode of transmission: Secondary | ICD-10-CM | POA: Diagnosis not present

## 2020-08-16 DIAGNOSIS — Z01419 Encounter for gynecological examination (general) (routine) without abnormal findings: Secondary | ICD-10-CM | POA: Diagnosis not present

## 2020-08-16 DIAGNOSIS — Z1231 Encounter for screening mammogram for malignant neoplasm of breast: Secondary | ICD-10-CM | POA: Diagnosis not present

## 2020-08-16 DIAGNOSIS — Z3040 Encounter for surveillance of contraceptives, unspecified: Secondary | ICD-10-CM | POA: Diagnosis not present

## 2020-12-14 IMAGING — CT CT ABD-PELV W/ CM
1 of 3 series · 13 of 32 positions shown, 19 images · IV contrast (APPLIED)
Comparison: None.

CLINICAL DATA: Left flank pain, left upper quadrant pain, vomiting

EXAM:
CT ABDOMEN AND PELVIS WITH CONTRAST
TECHNIQUE: Multidetector CT imaging of the abdomen and pelvis was performed
using the standard protocol following bolus administration of
intravenous contrast.
CONTRAST:  100mL [id number] IOPAMIDOL ([id number]) INJECTION 61%

[Series 2: abd/pelvis w/cm · axial · 0.80mm/px · z∈[-449,-64]mm · 13 of 91 slices shown, 19 images]
[im 7/91  soft-tissue]
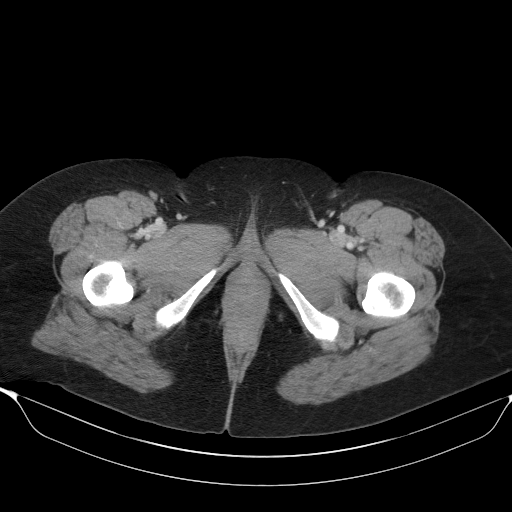
[im 7/91  bone]
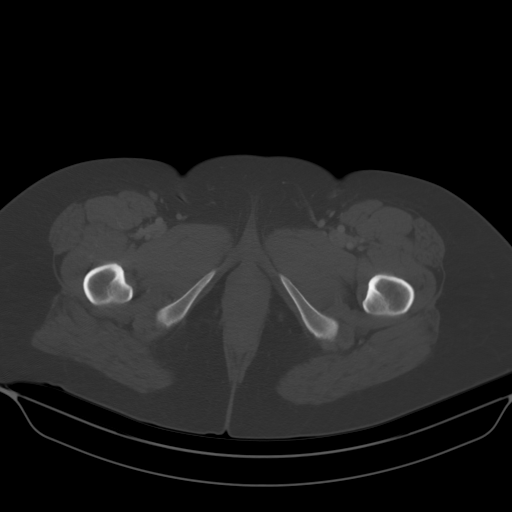
[im 13/91  soft-tissue]
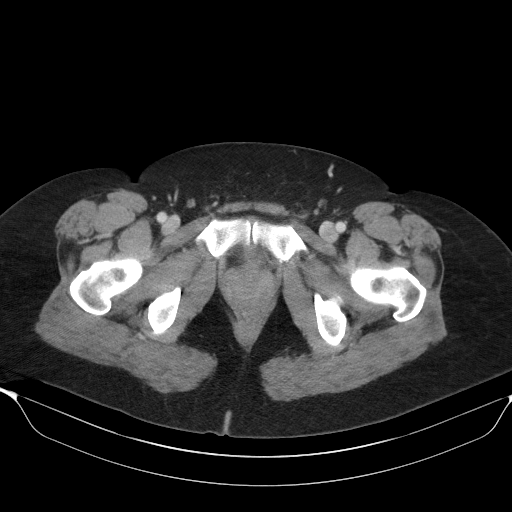
[im 20/91  soft-tissue]
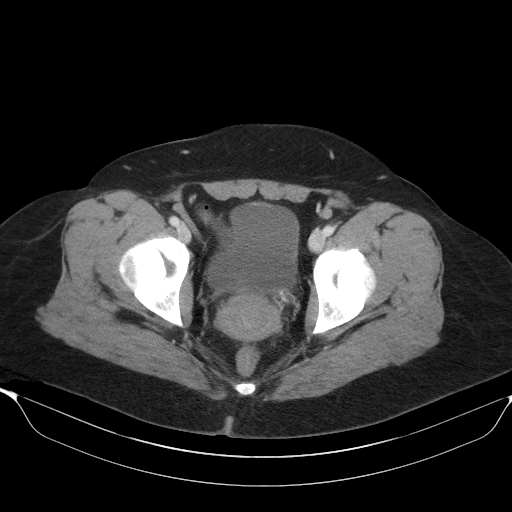
[im 26/91  soft-tissue]
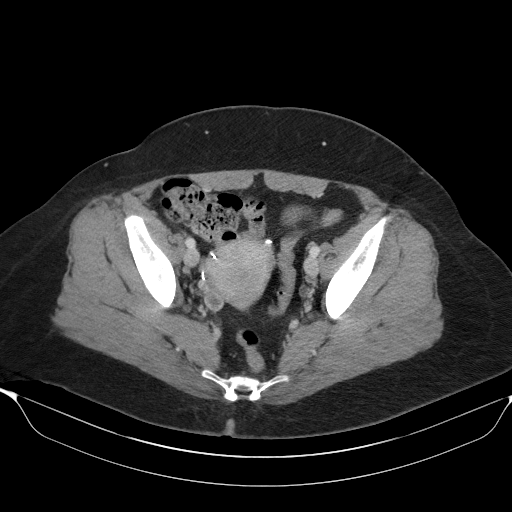
[im 33/91  soft-tissue]
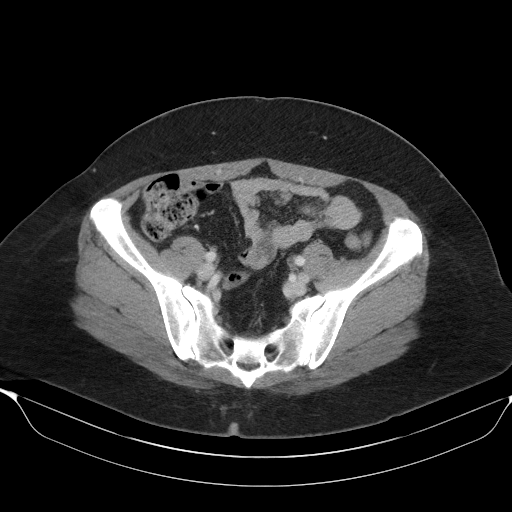
[im 39/91  soft-tissue]
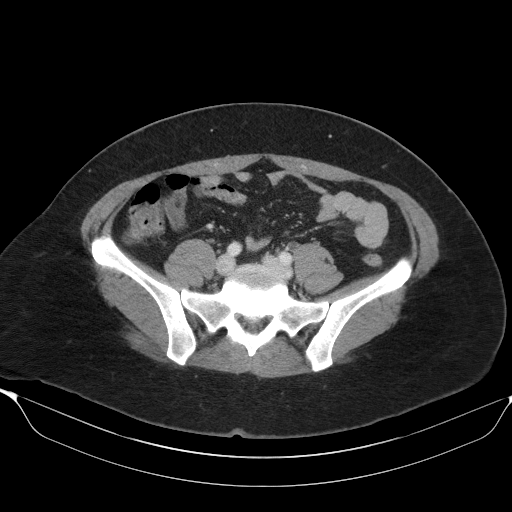
[im 46/91  soft-tissue]
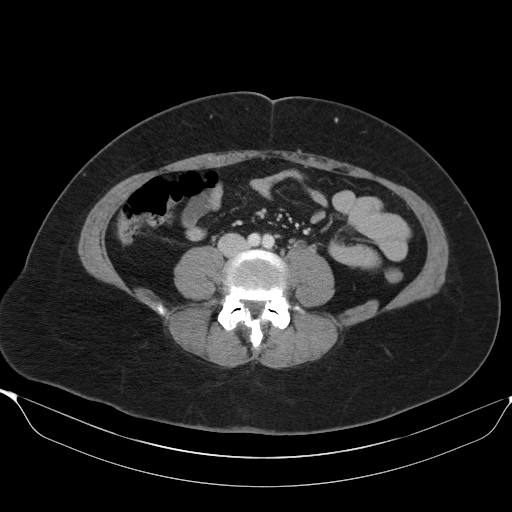
[im 52/91  soft-tissue]
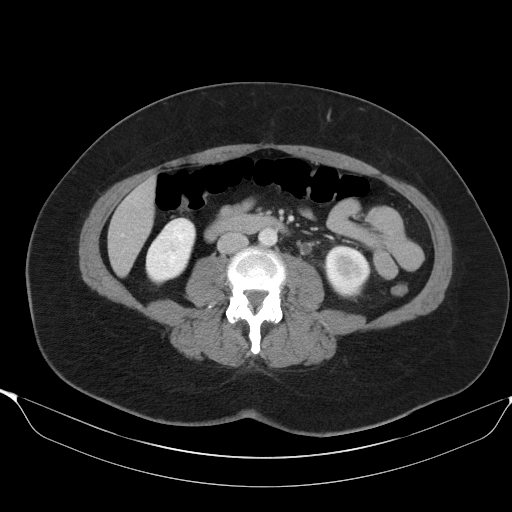
[im 58/91  soft-tissue]
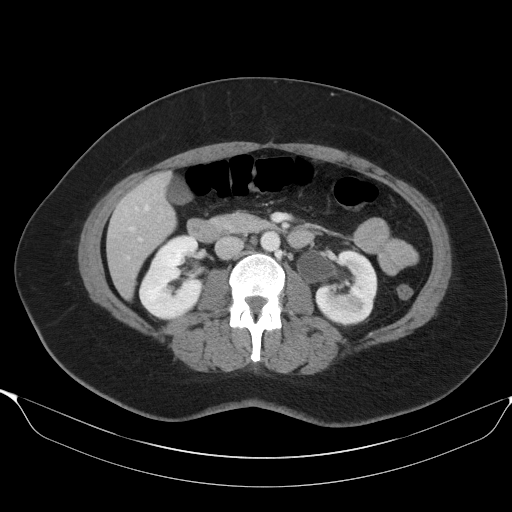
[im 58/91  bone]
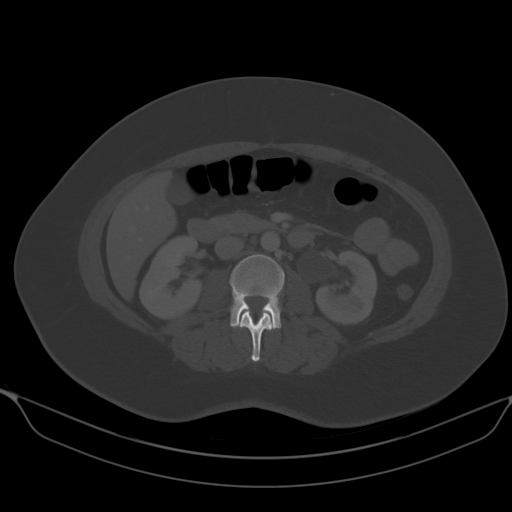
[im 65/91  soft-tissue]
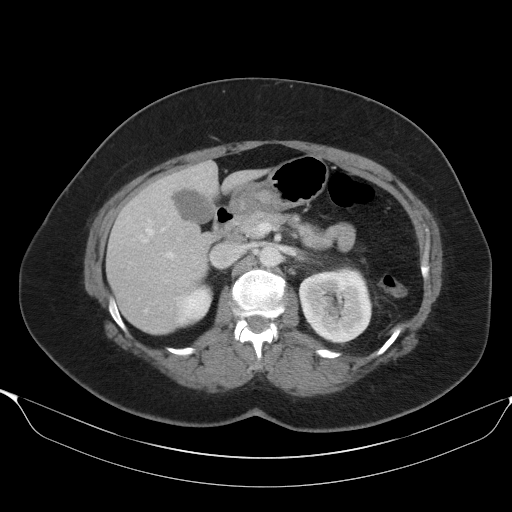
[im 65/91  lung]
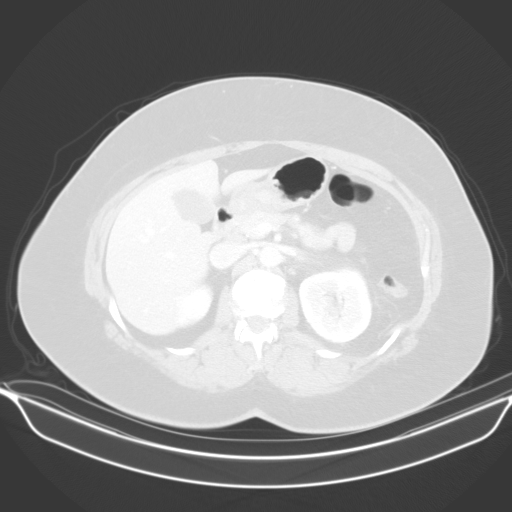
[im 71/91  soft-tissue]
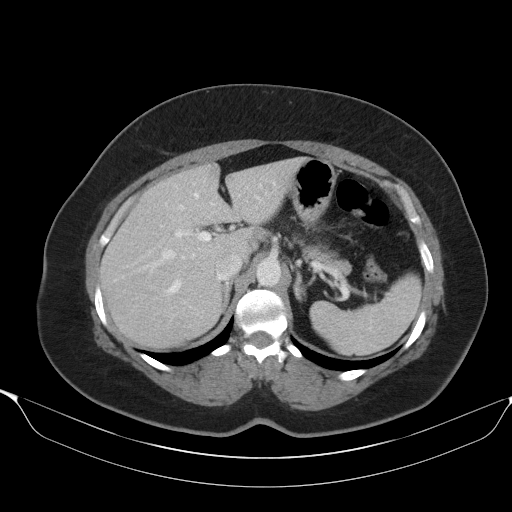
[im 71/91  lung]
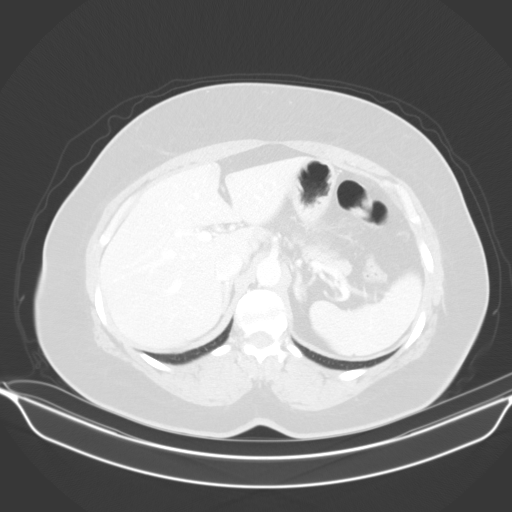
[im 78/91  soft-tissue]
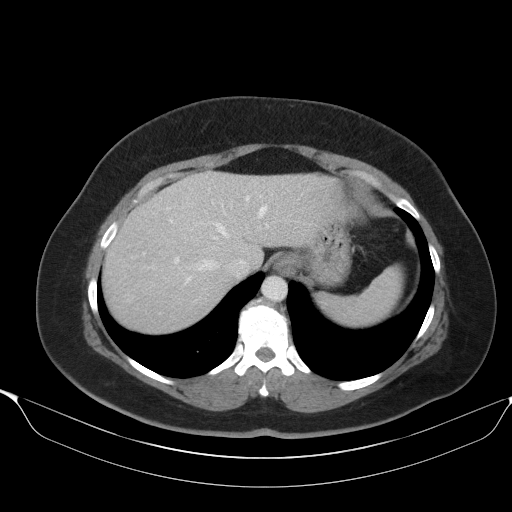
[im 78/91  lung]
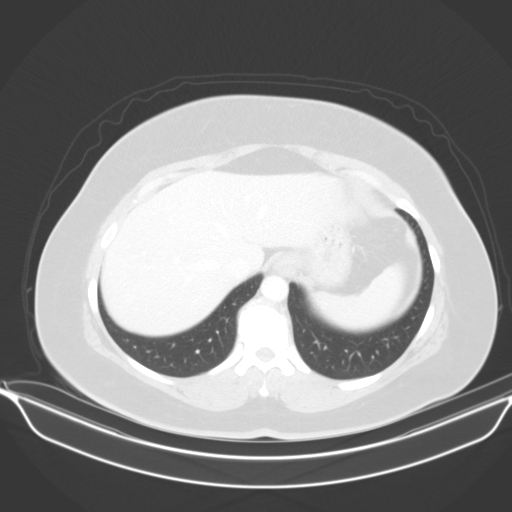
[im 84/91  soft-tissue]
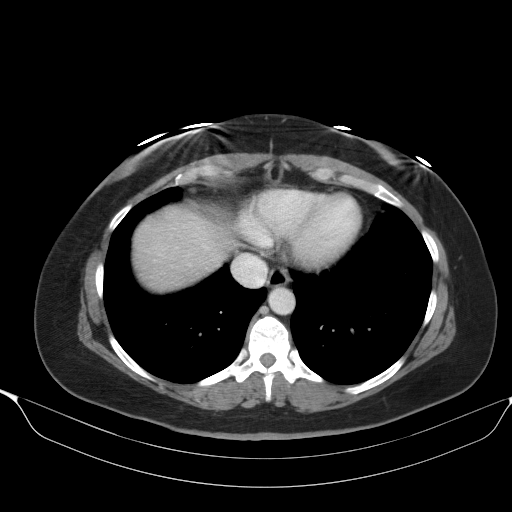
[im 84/91  lung]
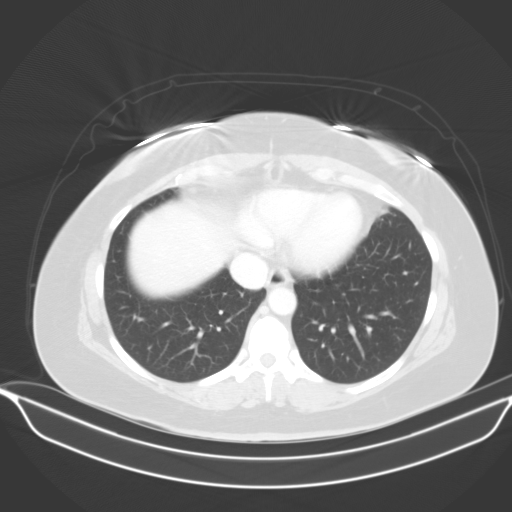

[13 of 32 positions shown; findings below may reference images not displayed]

FINDINGS: Lower chest: No acute abnormality.

Hepatobiliary: No focal liver abnormality is seen. No gallstones,
gallbladder wall thickening, or biliary dilatation.

Pancreas: Unremarkable.

Spleen: Unremarkable.

Adrenals/Urinary Tract: There is a 5 mm obstructing calculus at the
left ureterovesical junction with proximal hydroureter and mild
hydronephrosis. There are no renal calculi. Bladder is unremarkable.

Stomach/Bowel: Stomach is within normal limits. Bowel is normal in
caliber. The appendix is not visualized.

Vascular/Lymphatic: No significant vascular findings are present. No
enlarged abdominal or pelvic lymph nodes.

Reproductive: Evidence of bilateral tubal ligation. Otherwise
unremarkable

Other: No abdominal wall hernia or abnormality. No abdominopelvic
ascites.

Musculoskeletal: Mild lumbar spine degenerative changes.
IMPRESSION: 5 mm obstructing calculus at the left ureterovesical junction with
proximal hydroureter and mild hydronephrosis.

## 2023-09-17 ENCOUNTER — Other Ambulatory Visit: Payer: Self-pay | Admitting: Obstetrics and Gynecology

## 2023-09-17 DIAGNOSIS — Z1231 Encounter for screening mammogram for malignant neoplasm of breast: Secondary | ICD-10-CM

## 2023-10-29 ENCOUNTER — Ambulatory Visit: Payer: Managed Care, Other (non HMO)

## 2023-11-07 ENCOUNTER — Ambulatory Visit
Admission: RE | Admit: 2023-11-07 | Discharge: 2023-11-07 | Disposition: A | Discharge status: Home / Self Care | Payer: Managed Care, Other (non HMO) | Source: Ambulatory Visit | Attending: Obstetrics and Gynecology | Admitting: Obstetrics and Gynecology

## 2023-11-07 DIAGNOSIS — Z1231 Encounter for screening mammogram for malignant neoplasm of breast: Secondary | ICD-10-CM

## 2024-03-16 DIAGNOSIS — R635 Abnormal weight gain: Secondary | ICD-10-CM | POA: Diagnosis not present

## 2024-03-16 DIAGNOSIS — K743 Primary biliary cirrhosis: Secondary | ICD-10-CM | POA: Diagnosis not present

## 2024-05-18 DIAGNOSIS — K743 Primary biliary cirrhosis: Secondary | ICD-10-CM | POA: Diagnosis not present

## 2024-07-01 DIAGNOSIS — E785 Hyperlipidemia, unspecified: Secondary | ICD-10-CM | POA: Diagnosis not present

## 2024-07-01 DIAGNOSIS — E669 Obesity, unspecified: Secondary | ICD-10-CM | POA: Diagnosis not present

## 2024-07-01 DIAGNOSIS — E039 Hypothyroidism, unspecified: Secondary | ICD-10-CM | POA: Diagnosis not present

## 2024-07-01 DIAGNOSIS — F419 Anxiety disorder, unspecified: Secondary | ICD-10-CM | POA: Diagnosis not present
